# Patient Record
Sex: Female | Born: 1940 | Race: Black or African American | Hispanic: No | Smoking: Never smoker
Health system: Southern US, Community
[De-identification: ages and names within clinical notes are randomized; demographics above are authoritative.]

---

## 2015-07-13 ENCOUNTER — Emergency Department (HOSPITAL_COMMUNITY)
Admission: EM | Admit: 2015-07-13 | Discharge: 2015-07-13 | Disposition: A | Discharge status: Home / Self Care | Payer: Medicare Other | Attending: Emergency Medicine | Admitting: Emergency Medicine

## 2015-07-13 ENCOUNTER — Encounter (HOSPITAL_COMMUNITY): Payer: Self-pay | Admitting: Emergency Medicine

## 2015-07-13 DIAGNOSIS — R519 Headache, unspecified: Secondary | ICD-10-CM

## 2015-07-13 DIAGNOSIS — R252 Cramp and spasm: Secondary | ICD-10-CM

## 2015-07-13 DIAGNOSIS — R51 Headache: Secondary | ICD-10-CM | POA: Diagnosis not present

## 2015-07-13 DIAGNOSIS — I10 Essential (primary) hypertension: Secondary | ICD-10-CM | POA: Insufficient documentation

## 2015-07-13 DIAGNOSIS — M79606 Pain in leg, unspecified: Secondary | ICD-10-CM | POA: Diagnosis not present

## 2015-07-13 NOTE — ED Notes (Signed)
Pt states she has no pain or complaints. Endorses occasional leg cramps and stress. Pt states she was reading an article about secret heart disease and became concerned and would like to be checked out.

## 2015-07-13 NOTE — ED Provider Notes (Signed)
CSN: [id number] date & time 07/13/15  1122 History   First MD Initiated Contact with Patient 07/13/15 1206     Chief Complaint  Patient presents with  . Headache  . Leg Pain     (Consider location/radiation/quality/duration/timing/severity/associated sxs/prior Treatment) HPI  75 year old female presents with a chief complaint of headache and leg pain. She states she's been getting cramps in her legs intermittently for months. Has had an intermittent right-sided headache for a similar time. Last night she was reading an article in a magazine that discussed risks of heart disease. It inferred or she inferred that leg cramps and swelling in her legs could be a sign of heart disease. She is concerned that she may be having blockages in her heart. She has not had any chest pain or shortness of breath. Last had a headache last night. Does not get a headache every day. No dizziness or weakness. No blurry vision. Had leg cramps last night as well at the same time. Both of these are resolved currently. Patient states she has no medical problems and is not on medicines. She sees the [facility] in [place]. She has been living in [place] 11 years. She states that she wanted her blood pressure checked and an evaluation.   History reviewed. No pertinent past medical history. History reviewed. No pertinent past surgical history. History reviewed. No pertinent family history. Social History  Substance Use Topics  . Smoking status: Never Smoker   . Smokeless tobacco: None  . Alcohol Use: No   OB History    No data available     Review of Systems  Constitutional: Negative for fever.  Eyes: Negative for visual disturbance.  Respiratory: Negative for shortness of breath.   Cardiovascular: Negative for chest pain and leg swelling.  Gastrointestinal: Negative for vomiting and abdominal pain.  Musculoskeletal: Positive for myalgias.  Neurological: Positive for headaches. Negative for dizziness,  weakness and numbness.  All other systems reviewed and are negative.     Allergies  Review of patient's allergies indicates no known allergies.  Home Medications   Prior to Admission medications   Not on File   BP 193/77 mmHg  Pulse 70  Temp(Src) 98.4 F (36.9 C) (Oral)  Resp 18  Wt 105 lb (47.628 kg)  SpO2 99% Physical Exam  Constitutional: She is oriented to person, place, and time. She appears well-developed and well-nourished.  HENT:  Head: Normocephalic and atraumatic.  Right Ear: External ear normal.  Left Ear: External ear normal.  Nose: Nose normal.  Eyes: EOM are normal. Pupils are equal, round, and reactive to light. Right eye exhibits no discharge. Left eye exhibits no discharge.  Neck: Neck supple.  Cardiovascular: Normal rate, regular rhythm and normal heart sounds.   Pulses:      Dorsalis pedis pulses are 2+ on the right side, and 2+ on the left side.  Pulmonary/Chest: Effort normal and breath sounds normal.  Abdominal: Soft. There is no tenderness.  Neurological: She is alert and oriented to person, place, and time.  CN 2-12 grossly intact. 5/5 strength in all 4 extremities. Grossly normal sensation  Skin: Skin is warm and dry.  Nursing note and vitals reviewed.   ED Course  Procedures (including critical care time) Labs Review Labs Reviewed - No data to display  Imaging Review No results found. I have personally reviewed and evaluated these images and lab results as part of my medical decision-making.   EKG Interpretation None  MDM   Final diagnoses:  Right-sided headache  Cramps of lower extremity, unspecified laterality  Essential hypertension    Patient currently is asymptomatic. I highly doubt ACS given no or recently current CP or dyspnea. Discussed possible lab testing given HTN with her age and no prior labwork. She declines. Also does not want imaging of head given headache over 1 month. She mostly seems to want PCP follow up.  I discussed her needing to call the number on paperwork to help set up PCP. She seems reasonable and appears to be able to make medical decisions and thus will discharge without workup. Discussed return precautions.    Pricilla LovelessScott Lilou Kneip, MD 07/13/15 [phone]

## 2015-07-13 NOTE — ED Notes (Signed)
Pt sts HA and leg cramps x 1 month

## 2016-08-07 ENCOUNTER — Encounter (HOSPITAL_COMMUNITY): Payer: Self-pay | Admitting: Nurse Practitioner

## 2016-08-07 ENCOUNTER — Emergency Department (HOSPITAL_COMMUNITY)
Admission: EM | Admit: 2016-08-07 | Discharge: 2016-08-07 | Disposition: A | Discharge status: Home / Self Care | Payer: Medicare HMO | Attending: Emergency Medicine | Admitting: Emergency Medicine

## 2016-08-07 DIAGNOSIS — M79652 Pain in left thigh: Secondary | ICD-10-CM | POA: Diagnosis present

## 2016-08-07 DIAGNOSIS — Y9389 Activity, other specified: Secondary | ICD-10-CM | POA: Diagnosis not present

## 2016-08-07 DIAGNOSIS — Y999 Unspecified external cause status: Secondary | ICD-10-CM | POA: Diagnosis not present

## 2016-08-07 DIAGNOSIS — Y92039 Unspecified place in apartment as the place of occurrence of the external cause: Secondary | ICD-10-CM | POA: Diagnosis not present

## 2016-08-07 DIAGNOSIS — X500XXA Overexertion from strenuous movement or load, initial encounter: Secondary | ICD-10-CM | POA: Insufficient documentation

## 2016-08-07 DIAGNOSIS — R252 Cramp and spasm: Secondary | ICD-10-CM | POA: Diagnosis not present

## 2016-08-07 NOTE — Discharge Instructions (Signed)
Take anti-inflammatory medications as needed for muscle pains. Keep well hydrated while you are moving boxes, including electrolyte solutions.  Return for worsening symptoms, including difficulty breathing, passing out, chest pain, leg swelling, new weakness/numbness, difficulty walking, or any other symptoms concerning to you.

## 2016-08-07 NOTE — ED Triage Notes (Signed)
Pt presents with c/o right leg pain. She describes the pain as a cramping from her right groin to right knee. The pain began last week after she was moving furniture around her apartment. She has taken aleve with relieve but is concerned that the pain persists.

## 2016-08-07 NOTE — ED Provider Notes (Signed)
[facility]-EMERGENCY DEPT Provider Note   CSN: [id number] Arrival date & time: 08/07/16  1100   By signing my name below, I, Clarisse GougeXavier Herndon, attest that this documentation has been prepared under the direction and in the presence of Liu, Neysa Bonitoana Duo, MD. Electronically signed, Clarisse GougeXavier Herndon, ED Scribe. 08/07/16. 1:41 PM.   History   Chief Complaint Chief Complaint  Patient presents with  . Leg Pain   The history is provided by the patient and medical records. No language interpreter was used.  Leg Pain   This is a new problem. The problem occurs daily. The problem has been gradually worsening. The pain is present in the left upper leg and right upper leg. The quality of the pain is described as constant. The pain is at a severity of 5/10. The pain is moderate. Pertinent negatives include no numbness, full range of motion, no stiffness, no tingling and no itching. The symptoms are aggravated by contact. She has tried OTC pain medications, activity and rest for the symptoms. The treatment provided mild relief. There has been no history of extremity trauma. Family history is significant for no rheumatoid arthritis and no gout.    Danielle Acevedo is a 76 y.o. female presenting to the Emergency Department concerning intermittent leg cramps x ~1 week. She states her pain began after she lifted heavy furniture while moving ~1 week ago. Occasional headache also noted. She states her pain has improved from 9/10 to 5/10 pain from triage to evaluation. Now pain free. She describes fluctuating soreness and cramping occasionally radiating to the groin. Pt taking aleve and tylenol with minimal relief. She states she is mostly indoors. She notes NL food and fluid intake and she states she is currently awaiting dentures. Pt notes h/o leg cramps last year. Pt reportedly very active normally. She states she participates in the [employer], but her pain prevented her participation last year. She is concerned that her pain  may be a sign of stroke. No weakness, numbness, chest pain, SOB or trauma noted. No other complaints at this time.   History reviewed. No pertinent past medical history.  There are no active problems to display for this patient.   History reviewed. No pertinent surgical history.  OB History    No data available       Home Medications    Prior to Admission medications   Not on File    Family History History reviewed. No pertinent family history.  Social History Social History  Substance Use Topics  . Smoking status: Never Smoker  . Smokeless tobacco: Never Used  . Alcohol use No     Allergies   Patient has no known allergies.   Review of Systems Review of Systems  Musculoskeletal: Positive for arthralgias, gait problem and myalgias. Negative for stiffness.  Skin: Negative for color change, itching and wound.  Neurological: Positive for headaches. Negative for tingling and numbness.  All other systems reviewed and are negative.    Physical Exam Updated Vital Signs BP (!) 141/91 (BP Location: Left Arm)   Pulse 64   Temp 98 F (36.7 C) (Oral)   Resp 20   SpO2 99%   Physical Exam Physical Exam  Nursing note and vitals reviewed. Constitutional: Well developed, well nourished, non-toxic, and in no acute distress Head: Normocephalic and atraumatic.  Mouth/Throat: Oropharynx is clear and moist.  Neck: Normal range of motion. Neck supple.  Cardiovascular: Normal rate and regular rhythm.   Pulmonary/Chest: Effort normal and breath sounds normal.  Abdominal: Soft. There is no tenderness. There is no rebound and no guarding.  Musculoskeletal: Normal range of motion.  Neurological: Alert, no facial droop, fluent speech, moves all extremities symmetrically. Bilateral DP pulses intact. Full strength and sensation in BLE's. Skin: Skin is warm and dry.  Psychiatric: Cooperative   ED Treatments / Results  DIAGNOSTIC STUDIES: Oxygen Saturation is 99% on RA, NL by  my interpretation.    COORDINATION OF CARE: 1:37 PM-Discussed next steps with pt. Pt verbalized understanding and is agreeable with the plan. Pt prepared for d/c, advised of symptomatic care at home and return precautions.    Labs (all labs ordered are listed, but only abnormal results are displayed) Labs Reviewed - No data to display  EKG  EKG Interpretation None       Radiology No results found.  Procedures Procedures (including critical care time)  Medications Ordered in ED Medications - No data to display   Initial Impression / Assessment and Plan / ED Course  I have reviewed the triage vital signs and the nursing notes.  Pertinent labs & imaging results that were available during my care of the patient were reviewed by me and considered in my medical decision making (see chart for details).     Presents with intermittent leg cramps since lifting boxes and moving furniture this week. Came in because she was concerned about stroke after reading about it online. Had severe right leg cramp prior to arrival, but now pain free w/o intervention. Exam shows Neurovascularly intact bilateral lower extremities. No tenderness elicited on exam. No concern for CVA. No leg swelling or concern for DVT at this time. Discussed supportive care management and rest. Strict return and follow-up instructions reviewed. She expressed understanding of all discharge instructions and felt comfortable with the plan of care.   Final Clinical Impressions(s) / ED Diagnoses   Final diagnoses:  Muscle cramp    New Prescriptions New Prescriptions   No medications on file   I personally performed the services described in this documentation, which was scribed in my presence. The recorded information has been reviewed and is accurate.     Lavera Guise, MD 08/07/16 1350

## 2016-12-31 ENCOUNTER — Emergency Department (HOSPITAL_COMMUNITY)
Admission: EM | Admit: 2016-12-31 | Discharge: 2016-12-31 | Disposition: A | Discharge status: Home / Self Care | Payer: Medicare HMO | Attending: Emergency Medicine | Admitting: Emergency Medicine

## 2016-12-31 ENCOUNTER — Other Ambulatory Visit: Payer: Self-pay

## 2016-12-31 ENCOUNTER — Encounter (HOSPITAL_COMMUNITY): Payer: Self-pay | Admitting: Emergency Medicine

## 2016-12-31 DIAGNOSIS — R252 Cramp and spasm: Secondary | ICD-10-CM | POA: Diagnosis not present

## 2016-12-31 DIAGNOSIS — M79651 Pain in right thigh: Secondary | ICD-10-CM | POA: Diagnosis present

## 2016-12-31 DIAGNOSIS — M79604 Pain in right leg: Secondary | ICD-10-CM

## 2016-12-31 NOTE — ED Triage Notes (Signed)
Pt stating she has right leg pain and wants to be checked out before she competes in a track competition with the [employer].  Pt denies any other symptoms.

## 2016-12-31 NOTE — ED Notes (Signed)
Pt stable,ambulatory, and verbalizes understanding of D/C instructions.   

## 2016-12-31 NOTE — ED Provider Notes (Signed)
[facility] Provider Note   CSN: [id number] Arrival date & time: 12/31/16  0741     History   Chief Complaint Chief Complaint  Patient presents with  . Leg Pain    HPI Danielle Acevedo is a 76 y.o. female who presents with right thigh cramping.  No significant past medical history.  The patient states that she is planning to run a marathon and wanted to have her leg checked before she signed up for the race.  She states she has a problem before intermittently.  The cramping goes from the right proximal anterior thigh to the distal thigh.  She also has cramping over the right buttocks area.  The cramping seems to be worse after long periods of activity.  It is better with rest.  She sometimes has it at night as well.  She is eating and drinking normally.  She takes Motrin or Tylenol for pain as needed.  She does not have a primary care provider.  HPI  History reviewed. No pertinent past medical history.  There are no active problems to display for this patient.   History reviewed. No pertinent surgical history.  OB History    No data available       Home Medications    Prior to Admission medications   Not on File    Family History History reviewed. No pertinent family history.  Social History Social History   Tobacco Use  . Smoking status: Never Smoker  . Smokeless tobacco: Never Used  Substance Use Topics  . Alcohol use: No  . Drug use: No     Allergies   Patient has no known allergies.   Review of Systems Review of Systems  Respiratory: Negative for shortness of breath.   Cardiovascular: Negative for chest pain.  Musculoskeletal: Negative for arthralgias, gait problem, joint swelling and myalgias.       +cramping  All other systems reviewed and are negative.    Physical Exam Updated Vital Signs BP (!) 154/72   Pulse (!) 56   Ht 5\' 3"  (1.6 m)   Wt 52.2 kg (115 lb)   SpO2 100%   BMI 20.37 kg/m   Physical  Exam  Constitutional: She is oriented to person, place, and time. She appears well-developed and well-nourished. No distress.  HENT:  Head: Normocephalic and atraumatic.  Eyes: Conjunctivae are normal. Pupils are equal, round, and reactive to light. Right eye exhibits no discharge. Left eye exhibits no discharge. No scleral icterus.  Neck: Normal range of motion.  Cardiovascular: Normal rate.  Pulmonary/Chest: Effort normal. No respiratory distress.  Abdominal: She exhibits no distension.  Musculoskeletal:  Right lower extremity: No obvious swelling, deformity, or warmth. No tenderness or spasms noted. FROM of knee and hip. N/V intact.   Neurological: She is alert and oriented to person, place, and time.  Skin: Skin is warm and dry.  Psychiatric: She has a normal mood and affect. Her behavior is normal.  Nursing note and vitals reviewed.    ED Treatments / Results  Labs (all labs ordered are listed, but only abnormal results are displayed) Labs Reviewed - No data to display  EKG  EKG Interpretation None       Radiology No results found.  Procedures Procedures (including critical care time)  Medications Ordered in ED Medications - No data to display   Initial Impression / Assessment and Plan / ED Course  I have reviewed the triage vital signs and the nursing  notes.  Pertinent labs & imaging results that were available during my care of the patient were reviewed by me and considered in my medical decision making (see chart for details).  76 year old female with intermittent leg cramping. Vitals are normal. She denies any symptoms currently. I talked with her at length about getting a primary doctor for her check ups and preventative care and she basically refused saying that "[facility] has the best care for seniors". She states she doesn't receive preventative care anyways - she doesn't want colonoscopies or vaccinations, etc. She does have a PCP [facility] in [place] but  hasn't seen them in years. Her exam is unremarkable. I offered to check electrolytes today but she states she has to go get ready for church and will come back another time. Pt discharged in good condition.  Final Clinical Impressions(s) / ED Diagnoses   Final diagnoses:  Leg cramping  Right leg pain    ED Discharge Orders    None       Bethel BornGekas, Kelly Marie, PA-C 12/31/16 [id number]    Margarita Grizzleay, Danielle, MD 12/31/16 1016

## 2017-09-03 ENCOUNTER — Emergency Department (HOSPITAL_COMMUNITY)
Admission: EM | Admit: 2017-09-03 | Discharge: 2017-09-03 | Disposition: A | Discharge status: Home / Self Care | Payer: Medicare HMO | Attending: Emergency Medicine | Admitting: Emergency Medicine

## 2017-09-03 ENCOUNTER — Emergency Department (HOSPITAL_COMMUNITY): Payer: Medicare HMO

## 2017-09-03 ENCOUNTER — Encounter (HOSPITAL_COMMUNITY): Payer: Self-pay | Admitting: *Deleted

## 2017-09-03 ENCOUNTER — Other Ambulatory Visit: Payer: Self-pay

## 2017-09-03 DIAGNOSIS — Y999 Unspecified external cause status: Secondary | ICD-10-CM | POA: Insufficient documentation

## 2017-09-03 DIAGNOSIS — W230XXA Caught, crushed, jammed, or pinched between moving objects, initial encounter: Secondary | ICD-10-CM | POA: Diagnosis not present

## 2017-09-03 DIAGNOSIS — Y9389 Activity, other specified: Secondary | ICD-10-CM | POA: Insufficient documentation

## 2017-09-03 DIAGNOSIS — Y92009 Unspecified place in unspecified non-institutional (private) residence as the place of occurrence of the external cause: Secondary | ICD-10-CM | POA: Insufficient documentation

## 2017-09-03 DIAGNOSIS — S60122A Contusion of left index finger with damage to nail, initial encounter: Secondary | ICD-10-CM | POA: Diagnosis not present

## 2017-09-03 DIAGNOSIS — S6992XA Unspecified injury of left wrist, hand and finger(s), initial encounter: Secondary | ICD-10-CM | POA: Diagnosis present

## 2017-09-03 DIAGNOSIS — S60022A Contusion of left index finger without damage to nail, initial encounter: Secondary | ICD-10-CM

## 2017-09-03 NOTE — Discharge Instructions (Addendum)
Please continue to keep finger elevated. Alternate ice or heat as needed.Please take ibuprofen for pain as needed. Please return to the ED if you experience any tingling on extremity, finger becomes cold, or color changes.

## 2017-09-03 NOTE — ED Triage Notes (Signed)
Pt reports slamming her left index finger tip in her closet door approx 6 hours ago, swelling noted to fingertip. States pain radiates up her arm. No redness noted to her arm. No acute distress is noted at triage.

## 2017-09-03 NOTE — ED Provider Notes (Signed)
[facility]   CSN: [id number] Arrival date & time: 09/03/17  1759     History   Chief Complaint Chief Complaint  Patient presents with  . Finger Injury    HPI Danielle Acevedo is a 77 y.o. female.  77 year old female with no past medical history presents to the ED status post finger injury this morning.  She reports that she was closing her closet door when she smashed her finger with the door.  She then states that she felt some tingling to her hand and elbow and left shoulder.  She reports the tingling has resolved patient has tried applying some over-the-counter muscle pain relief cream.  She states she got alarmed when she saw her finger swelling.  Patient has not taken any medication for the pain but states that she has tried to keep finger elevated.  She denies any other complaints at this time, chest pain, or shortness of breath.     History reviewed. No pertinent past medical history.  There are no active problems to display for this patient.   History reviewed. No pertinent surgical history.   OB History   None      Home Medications    Prior to Admission medications   Not on File    Family History History reviewed. No pertinent family history.  Social History Social History   Tobacco Use  . Smoking status: Never Smoker  . Smokeless tobacco: Never Used  Substance Use Topics  . Alcohol use: No  . Drug use: No     Allergies   Patient has no known allergies.   Review of Systems Review of Systems  Constitutional: Negative for chills and fever.  HENT: Negative for ear pain and sore throat.   Eyes: Negative for pain and visual disturbance.  Respiratory: Negative for cough and shortness of breath.   Cardiovascular: Negative for chest pain and palpitations.  Gastrointestinal: Negative for abdominal pain and vomiting.  Genitourinary: Negative for dysuria and hematuria.  Musculoskeletal: Negative for  arthralgias and back pain.  Skin: Positive for wound. Negative for color change and rash.  Neurological: Negative for seizures and syncope.  All other systems reviewed and are negative.    Physical Exam Updated Vital Signs BP (!) 177/59 (BP Location: Right Arm)   Pulse 63   Temp 98.7 F (37.1 C)   Resp 16   SpO2 99%   Physical Exam  Constitutional: She is oriented to person, place, and time. She appears well-developed and well-nourished. No distress.  HENT:  Head: Normocephalic and atraumatic.  Mouth/Throat: Oropharynx is clear and moist. No oropharyngeal exudate.  Eyes: Pupils are equal, round, and reactive to light.  Neck: Normal range of motion.  Cardiovascular: Regular rhythm and normal heart sounds.  Pulses:      Radial pulses are 2+ on the right side, and 2+ on the left side.  Pulmonary/Chest: Effort normal and breath sounds normal. No respiratory distress.  Abdominal: Soft. Bowel sounds are normal. She exhibits no distension. There is no tenderness.  Musculoskeletal: She exhibits edema and tenderness. She exhibits no deformity.       Left hand: She exhibits tenderness and swelling. Normal sensation noted. Decreased sensation is not present in the ulnar distribution, is not present in the medial redistribution and is not present in the radial distribution. Normal strength noted. She exhibits no finger abduction, no thumb/finger opposition and no wrist extension trouble.       Hands:  Right lower leg: She exhibits no edema.       Left lower leg: She exhibits no edema.  Subdermal hematoma and swelling noted. No skin breakage. Patient able to move finger.Pulses presents BL.   Neurological: She is alert and oriented to person, place, and time.  Skin: Skin is warm and dry. Capillary refill takes less than 2 seconds. There is erythema.  Psychiatric: She has a normal mood and affect.  Nursing note and vitals reviewed.    ED Treatments / Results  Labs (all labs ordered  are listed, but only abnormal results are displayed) Labs Reviewed - No data to display  EKG None  Radiology Dg Finger Index Left  Result Date: 09/03/2017 CLINICAL DATA:  Shut index finger in door with bruise and swelling to the distal finger EXAM: LEFT INDEX FINGER 2+V COMPARISON:  None. FINDINGS: No acute displaced fracture or malalignment. No radiopaque foreign body. Degenerative changes at the DIP and MCP joints. IMPRESSION: No acute osseous abnormality. Electronically Signed   By: Jasmine Pang M.D.   On: 09/03/2017 19:06    Procedures Procedures (including critical care time)  Medications Ordered in ED Medications - No data to display   Initial Impression / Assessment and Plan / ED Course  I have reviewed the triage vital signs and the nursing notes.  Pertinent labs & imaging results that were available during my care of the patient were reviewed by me and considered in my medical decision making (see chart for details).    She reports with a left index finger contusion.  There is no breakage in the skin, drainage, no decreased strength.  As time I have placed the patient on a left index finger splint.  I have advised her to take Tylenol or ibuprofen for pain.  Return precautions have been provided.  Final Clinical Impressions(s) / ED Diagnoses   Final diagnoses:  Contusion of left index finger without damage to nail, initial encounter    ED Discharge Orders    None       Freddy Jaksch 09/03/17 2129    Wynetta Fines, MD 09/03/17 2303

## 2017-12-05 ENCOUNTER — Other Ambulatory Visit: Payer: Self-pay

## 2017-12-05 ENCOUNTER — Encounter (HOSPITAL_COMMUNITY): Payer: Self-pay | Admitting: *Deleted

## 2017-12-05 ENCOUNTER — Emergency Department (HOSPITAL_COMMUNITY)
Admission: EM | Admit: 2017-12-05 | Discharge: 2017-12-05 | Disposition: A | Discharge status: Home / Self Care | Payer: Medicare HMO | Attending: Emergency Medicine | Admitting: Emergency Medicine

## 2017-12-05 DIAGNOSIS — L720 Epidermal cyst: Secondary | ICD-10-CM

## 2017-12-05 DIAGNOSIS — M79642 Pain in left hand: Secondary | ICD-10-CM | POA: Diagnosis present

## 2017-12-05 NOTE — ED Provider Notes (Signed)
[facility] EMERGENCY DEPARTMENT Provider Note   CSN: [id number] Arrival date & time: 12/05/17  1134     History   Chief Complaint Chief Complaint  Patient presents with  . Hand Pain    HPI Danielle Acevedo is a 77 y.o. female who presents to ED for nondominant left hand concern.  States that she had an injury to her left index finger about 3 months ago when she smashed her finger in her closet door.  She was seen and evaluated here, given supportive measures which improved her symptoms.  However, since then she is noticed this mobile mass at the palmar base of her left thumb.  States that she has intermittent pain associated with it at night but otherwise does not bother her.  No history of similar symptoms in the past.  Denies any reinjuries, changes to sensation.  HPI  History reviewed. No pertinent past medical history.  There are no active problems to display for this patient.   History reviewed. No pertinent surgical history.   OB History   None      Home Medications    Prior to Admission medications   Not on File    Family History History reviewed. No pertinent family history.  Social History Social History   Tobacco Use  . Smoking status: Never Smoker  . Smokeless tobacco: Never Used  Substance Use Topics  . Alcohol use: No  . Drug use: No     Allergies   Patient has no known allergies.   Review of Systems Review of Systems  Constitutional: Negative for chills and fever.  Skin: Negative for wound.  Neurological: Negative for weakness and numbness.     Physical Exam Updated Vital Signs BP (!) 146/55 (BP Location: Right Arm)   Pulse 64   Temp 97.8 F (36.6 C) (Oral)   Resp 18   SpO2 98%   Physical Exam  Constitutional: She appears well-developed and well-nourished. No distress.  HENT:  Head: Normocephalic and atraumatic.  Eyes: Conjunctivae and EOM are normal. No scleral icterus.  Neck: Normal range of motion.    Pulmonary/Chest: Effort normal. No respiratory distress.  Neurological: She is alert.  Skin: No rash noted. She is not diaphoretic.  0.5 cm round, mobile cystic-like structure noted at the base of the left thumb on the palmar side.  No overlying erythema, tenderness to palpation, induration noted.  Psychiatric: She has a normal mood and affect.  Nursing note and vitals reviewed.    ED Treatments / Results  Labs (all labs ordered are listed, but only abnormal results are displayed) Labs Reviewed - No data to display  EKG None  Radiology No results found.  Procedures Procedures (including critical care time)  Medications Ordered in ED Medications - No data to display   Initial Impression / Assessment and Plan / ED Course  I have reviewed the triage vital signs and the nursing notes.  Pertinent labs & imaging results that were available during my care of the patient were reviewed by me and considered in my medical decision making (see chart for details).     77 year old female presents for lesion at the base of her left thumb that she noticed about 3 months ago.  States that it is a cystlike structure that moves when palpated.  It is not tender to touch.  On exam there is a 0.5 cm round, cystic-like mobile structure noted at the base of the left thumb.  No surrounding induration, erythema  or warmth of area noted.  She has full active and passive range of motion of digits.  Area is neurovascularly intact.  Suspect that symptoms are due to an epidermal cyst.  Will give hand follow-up but patient reassured that this will most likely spontaneously resolve.  Doubt vascular or infectious cause of symptoms.  Advised to return to ED for any severe worsening symptoms.  Portions of this note were generated with Scientist, clinical (histocompatibility and immunogenetics). Dictation errors may occur despite best attempts at proofreading.  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

## 2017-12-05 NOTE — Discharge Instructions (Signed)
Follow up with the hand specialist for further ideation if your symptoms persist. Return to ED for worsening symptoms, injuries or falls, redness or warmth of joints, losing sensation of arms.

## 2017-12-05 NOTE — ED Triage Notes (Signed)
Pt in c/o new knot to her left hand, she had an injury about two months ago and it healed, a few days ago she noted a small nodule under her skin that is movable, no significant pain but wants to get it checked

## 2018-04-09 ENCOUNTER — Encounter (HOSPITAL_COMMUNITY): Payer: Self-pay | Admitting: Emergency Medicine

## 2018-04-09 ENCOUNTER — Other Ambulatory Visit: Payer: Self-pay

## 2018-04-09 ENCOUNTER — Emergency Department (HOSPITAL_COMMUNITY)
Admission: EM | Admit: 2018-04-09 | Discharge: 2018-04-09 | Disposition: A | Discharge status: Home / Self Care | Payer: Medicare HMO | Attending: Emergency Medicine | Admitting: Emergency Medicine

## 2018-04-09 DIAGNOSIS — M62838 Other muscle spasm: Secondary | ICD-10-CM | POA: Insufficient documentation

## 2018-04-09 DIAGNOSIS — R252 Cramp and spasm: Secondary | ICD-10-CM

## 2018-04-09 NOTE — Discharge Instructions (Signed)
You have been seen today for muscle cramp. Please read and follow all provided instructions.   1. Medications: usual home medications 2. Treatment: rest, drink plenty of fluids 3. Follow Up: Please follow up with your primary doctor in 7 days for discussion of your diagnoses and further evaluation after today's visit; if you do not have a primary care doctor use the resource guide provided to find one; Please return to the ER for any new or worsening symptoms. Please obtain all of your results from medical records or have your doctors office obtain the results - share them with your doctor - you should be seen at your doctors office. Call today to arrange your follow up.   Take medications as prescribed. Please review all of the medicines and only take them if you do not have an allergy to them. Return to the emergency room for worsening condition or new concerning symptoms. Follow up with your regular doctor. If you don't have a regular doctor use one of the numbers below to establish a primary care doctor.  Please be aware that if you are taking birth control pills, taking other prescriptions, ESPECIALLY ANTIBIOTICS may make the birth control ineffective - if this is the case, either do not engage in sexual activity or use alternative methods of birth control such as condoms until you have finished the medicine and your family doctor says it is OK to restart them. If you are on a blood thinner such as COUMADIN, be aware that any other medicine that you take may cause the coumadin to either work too much, or not enough - you should have your coumadin level rechecked in next 7 days if this is the case.  ?  It is also a possibility that you have an allergic reaction to any of the medicines that you have been prescribed - Everybody reacts differently to medications and while MOST people have no trouble with most medicines, you may have a reaction such as nausea, vomiting, rash, swelling, shortness of breath.  If this is the case, please stop taking the medicine immediately and contact your physician.  ?  You should return to the ER if you develop severe or worsening symptoms.   Emergency Department Resource Guide 1) Find a Doctor and Pay Out of Pocket Although you won't have to find out who is covered by your insurance plan, it is a good idea to ask around and get recommendations. You will then need to call the office and see if the doctor you have chosen will accept you as a new patient and what types of options they offer for patients who are self-pay. Some doctors offer discounts or will set up payment plans for their patients who do not have insurance, but you will need to ask so you aren't surprised when you get to your appointment.  2) Contact Your Local Health Department Not all health departments have doctors that can see patients for sick visits, but many do, so it is worth a call to see if yours does. If you don't know where your local health department is, you can check in your phone book. The CDC also has a tool to help you locate your state's health department, and many state websites also have listings of all of their local health departments.  3) Find a Walk-in Clinic If your illness is not likely to be very severe or complicated, you may want to try a walk in clinic. These are popping up all over  the country in pharmacies, drugstores, and shopping centers. They're usually staffed by nurse practitioners or physician assistants that have been trained to treat common illnesses and complaints. They're usually fairly quick and inexpensive. However, if you have serious medical issues or chronic medical problems, these are probably not your best option.  No Primary Care Doctor: Call Health Connect at  [phone] - they can help you locate a primary care doctor that  accepts your insurance, provides certain services, etc. Physician Referral [phone]  Emergency Department Resource  Guide 1) Find a Doctor and Pay Out of Pocket Although you won't have to find out who is covered by your insurance plan, it is a good idea to ask around and get recommendations. You will then need to call the office and see if the doctor you have chosen will accept you as a new patient and what types of options they offer for patients who are self-pay. Some doctors offer discounts or will set up payment plans for their patients who do not have insurance, but you will need to ask so you aren't surprised when you get to your appointment.  2) Contact Your Local Health Department Not all health departments have doctors that can see patients for sick visits, but many do, so it is worth a call to see if yours does. If you don't know where your local health department is, you can check in your phone book. The CDC also has a tool to help you locate your state's health department, and many state websites also have listings of all of their local health departments.  3) Find a [facility] If your illness is not likely to be very severe or complicated, you may want to try a walk in clinic. These are popping up all over the country in pharmacies, drugstores, and shopping centers. They're usually staffed by nurse practitioners or physician assistants that have been trained to treat common illnesses and complaints. They're usually fairly quick and inexpensive. However, if you have serious medical issues or chronic medical problems, these are probably not your best option.  No Primary Care Doctor: Call Health Connect at  [phone] - they can help you locate a primary care doctor that  accepts your insurance, provides certain services, etc. Physician Referral Service- [phone]  Chronic Pain Problems: Organization         Address  Phone   Notes  [facility]  [phone] Patients need to be referred by their primary care doctor.   Medication Assistance: Organization          Address  Phone   Notes  [facility] Medication [facility] [address] [place], [place] [address] [phone] --Must be a resident of [facility] -- Must have NO insurance coverage whatsoever (no Medicaid/ Medicare, etc.) -- The pt. MUST have a primary care doctor that directs their care regularly and follows them in the community   [facility]  [phone]   [employer]  [phone]    Agencies that provide inexpensive medical care: Organization         Address  Phone   Notes  [place]  [phone]   [facility]    [phone]   [facility] [place], [place] [address] [phone]   [place] [address] [place] [phone]   [facility]    [phone]   [facility]    [phone]   °[facility] ° [address], [place] Phone:  [phone], Fax:  [phone] Hours of Operation:  9 am - 6 pm, M-F.  Also accepts Medicaid/Medicare and self-pay.  °[facility] ° [address], [place] Phone: [phone], Fax: [phone]. Hours of Operation:  8:30 am - 5:30 pm, M-F.  Also accepts Medicaid and self-pay.  °[facility] [place] [address], [place] Phone: [phone]   °[facility] [address], [place], [place] [phone], Ext. [phone] Mondays & Thursdays: 7-9 AM.  First 15 patients are seen on a first come, first serve basis. °  ° °Medicaid-accepting [place] Providers: ° °Organization         Address  Phone   Notes  °[facility] [address], [place] [phone] Also accepts self-pay patients.  °[facility] [address], [place] ° [phone]   °[facility] [address], [place] [phone]   °[facility] [address] [place] [address], [place] [phone]   °[facility] [address], [place]  ° [phone] Only accepts [place] Access Medicaid patients after they have their name applied to their card.  ° °Self-Pay (no insurance) in [place]: ° °Organization         Address  Phone   Notes  °Sickle Cell Patients, [facility] [address], [place] [phone]   °[facility] [address], [place] [phone]   °[facility] ° [address],  [phone]   °[facility]/Dr. Osei-Bonsu ° [address] [place] [address], [place] or [address], [place] [phone] Phone number for both [place] and [place] locations is the same.  °[facility] [address], [place] [phone]   °[facility]  [address] [place] [address],  or [address] [phone] °[phone]   °[facility] [address],  [phone], phone; [phone], fax Sees patients 1st and 3rd Saturday of every month.  Must not qualify for public or private insurance (i.e. Medicaid, Medicare, [place] Health Choice, Veterans' Benefits)  Household income should be no more than 200% of the poverty level The clinic cannot treat you if you are pregnant or think you are pregnant  Sexually transmitted diseases are not treated at the clinic.  °  ° °

## 2018-04-09 NOTE — ED Provider Notes (Signed)
[facility] EMERGENCY DEPARTMENT Provider Note   CSN: [id number] Arrival date & time: 04/09/18  3762    History   Chief Complaint No chief complaint on file.   HPI Danielle Acevedo is a 78 y.o. female presenting with no significant past medical history presenting with intermittent right thigh cramping for years. Patient reports cramping yesterday, but states symptoms resolved with aleve. Patient states walking on cement floor makes symptoms worse. Patient states she is eating and drinking fluids. Patient denies fever, cough, chest pain, shortness of breath, nausea, vomiting, abdominal pain, or diarrhea. Patient denies sick contacts. Patient reports she is very active in [employer]. Patient states she walks and plays basketball. Patient denies weakness, numbness, or difficulty walking. Patient reports she is moving to [place] and wants to set up a PCP when she moves. Patient denies injury.     HPI  History reviewed. No pertinent past medical history.  There are no active problems to display for this patient.   History reviewed. No pertinent surgical history.   OB History   No obstetric history on file.      Home Medications    Prior to Admission medications   Not on File    Family History No family history on file.  Social History Social History   Tobacco Use  . Smoking status: Never Smoker  . Smokeless tobacco: Never Used  Substance Use Topics  . Alcohol use: No  . Drug use: No     Allergies   Patient has no known allergies.   Review of Systems Review of Systems  Constitutional: Negative for chills, diaphoresis and fever.  HENT: Negative for congestion and rhinorrhea.   Eyes: Negative for visual disturbance.  Respiratory: Negative for cough and shortness of breath.   Cardiovascular: Negative for chest pain.  Gastrointestinal: Negative for abdominal pain, diarrhea, nausea and vomiting.  Endocrine: Negative for cold intolerance and  heat intolerance.  Genitourinary: Negative for dysuria.  Musculoskeletal: Negative for arthralgias and back pain.       Pt reports right thigh cramps.  Skin: Negative for color change, rash and wound.  Allergic/Immunologic: Negative for immunocompromised state.  Neurological: Negative for dizziness, tremors, syncope, facial asymmetry, speech difficulty, weakness, numbness and headaches.  Hematological: Negative for adenopathy.     Physical Exam Updated Vital Signs BP (!) 137/52 (BP Location: Right Arm)   Pulse 63   Temp 98 F (36.7 C) (Oral)   Resp 16   SpO2 98%   Physical Exam Vitals signs and nursing note reviewed.  Constitutional:      General: She is not in acute distress.    Appearance: She is well-developed. She is not diaphoretic.  HENT:     Head: Normocephalic and atraumatic.     Nose: Nose normal.     Mouth/Throat:     Mouth: Mucous membranes are moist.     Pharynx: No posterior oropharyngeal erythema.  Eyes:     Extraocular Movements: Extraocular movements intact.     Conjunctiva/sclera: Conjunctivae normal.     Pupils: Pupils are equal, round, and reactive to light.  Neck:     Musculoskeletal: Normal range of motion and neck supple.  Cardiovascular:     Rate and Rhythm: Normal rate and regular rhythm.     Heart sounds: Normal heart sounds. No murmur. No friction rub. No gallop.   Pulmonary:     Effort: Pulmonary effort is normal. No respiratory distress.     Breath sounds: Normal breath sounds.  No wheezing or rales.  Abdominal:     Palpations: Abdomen is soft.     Tenderness: There is no abdominal tenderness.  Musculoskeletal: Normal range of motion.        General: No swelling, tenderness or signs of injury.     Right hip: Normal. She exhibits normal range of motion, normal strength and no tenderness.     Left hip: She exhibits normal range of motion, normal strength and no tenderness.     Right knee: Normal. She exhibits normal range of motion, no  swelling and no effusion.     Left knee: Normal. She exhibits normal range of motion, no swelling and no effusion.     Right ankle: Normal. She exhibits normal range of motion, no swelling and no ecchymosis.     Left ankle: Normal. She exhibits normal range of motion, no swelling and no ecchymosis.     Comments: No skin changes. Non tender on palpation. Full ROM of legs bilaterally without discomfort. 5/5 strength in upper and lower extremities. Sensation intact. Ambulating without difficulty.   Skin:    General: Skin is warm.     Findings: No erythema or rash.  Neurological:     Mental Status: She is alert and oriented to person, place, and time.      ED Treatments / Results  Labs (all labs ordered are listed, but only abnormal results are displayed) Labs Reviewed - No data to display  EKG None  Radiology No results found.  Procedures Procedures (including critical care time)  Medications Ordered in ED Medications - No data to display   Initial Impression / Assessment and Plan / ED Course  I have reviewed the triage vital signs and the nursing notes.  Pertinent labs & imaging results that were available during my care of the patient were reviewed by me and considered in my medical decision making (see chart for details).       Patient presents with intermittent muscle cramps. Patient has been asymptomatic while in the ER. Offered to order labs to evaluate for electrolyte abnormalities and patient refused. Patient states her symptoms improve with aleve and does not want further testing. Patient states she has places to go and does not want to stay in the ER. Discussed drinking plenty of fluids, eating bananas, and stretching before and after exercise. Discussed return precautions with patient. Advised patient to follow up with PCP to monitor symptoms. Patient states she understands and agrees with plan.   Final Clinical Impressions(s) / ED Diagnoses   Final diagnoses:    Muscle cramp    ED Discharge Orders    None       Leretha Dykes, [place] 04/09/18 1041    Rolan Bucco, MD 04/09/18 1041

## 2018-04-09 NOTE — ED Notes (Signed)
Pt verbalized understanding of discharge paperwork and follow-up care.  °

## 2018-04-09 NOTE — ED Triage Notes (Signed)
Pt. Stated, rt. Leg cramping started yesterday morning.

## 2018-04-09 NOTE — ED Notes (Signed)
Pt ambulated from WT to room 35

## 2019-10-27 IMAGING — CR DG FINGER INDEX 2+V*L*
3 series · 3 of 3 positions shown · non-contrast
Comparison: None.

CLINICAL DATA: Shut index finger in door with bruise and swelling
to the distal finger

EXAM:
LEFT INDEX FINGER 2+V

[finger ap]
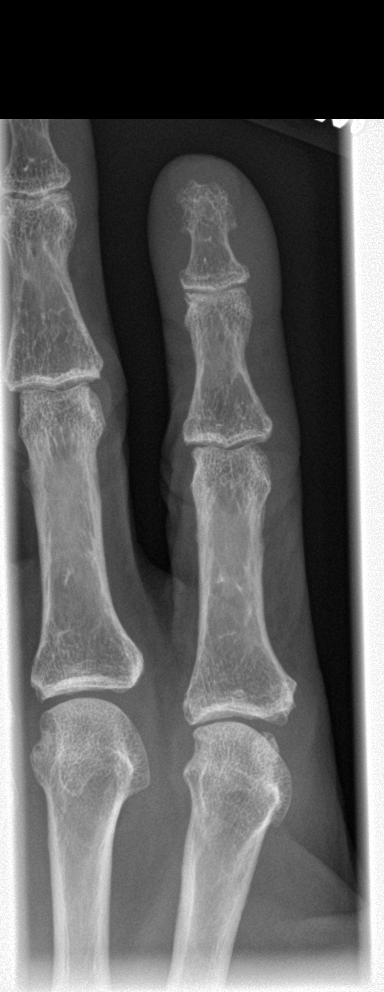

[finger obl]
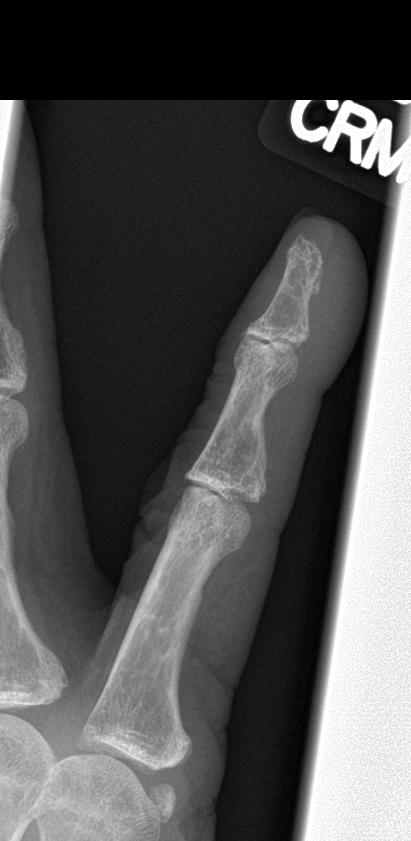

[finger lat]
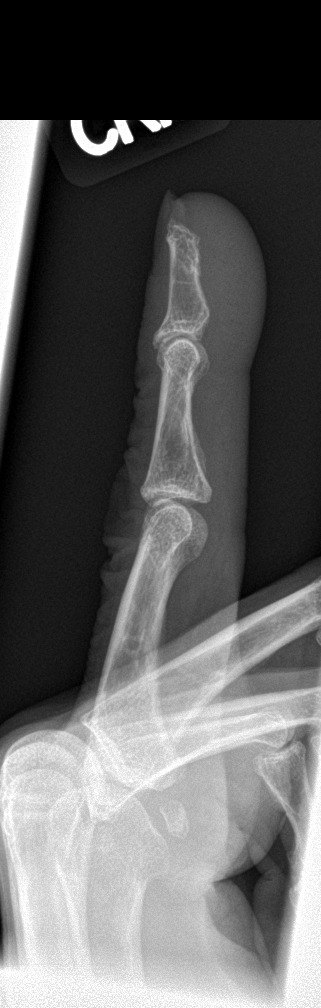

[3 of 3 positions shown; findings below may reference images not displayed]

FINDINGS: No acute displaced fracture or malalignment. No radiopaque foreign
body. Degenerative changes at the DIP and MCP joints.
IMPRESSION: No acute osseous abnormality.

## 2021-07-15 ENCOUNTER — Emergency Department (HOSPITAL_COMMUNITY)
Admission: EM | Admit: 2021-07-15 | Discharge: 2021-07-15 | Disposition: A | Discharge status: Home / Self Care | Payer: Medicare HMO | Attending: Emergency Medicine | Admitting: Emergency Medicine

## 2021-07-15 ENCOUNTER — Other Ambulatory Visit: Payer: Self-pay

## 2021-07-15 ENCOUNTER — Encounter (HOSPITAL_COMMUNITY): Payer: Self-pay

## 2021-07-15 DIAGNOSIS — R0981 Nasal congestion: Secondary | ICD-10-CM | POA: Diagnosis present

## 2021-07-15 DIAGNOSIS — J329 Chronic sinusitis, unspecified: Secondary | ICD-10-CM | POA: Insufficient documentation

## 2021-07-15 DIAGNOSIS — R001 Bradycardia, unspecified: Secondary | ICD-10-CM | POA: Insufficient documentation

## 2021-07-15 DIAGNOSIS — H9201 Otalgia, right ear: Secondary | ICD-10-CM | POA: Diagnosis not present

## 2021-07-15 MED ORDER — AMOXICILLIN-POT CLAVULANATE 875-125 MG PO TABS: 1.0000 | ORAL_TABLET | Freq: Two times a day (BID) | ORAL | 0 refills | Status: DC

## 2021-07-15 NOTE — Discharge Instructions (Addendum)
Your exam today was reassuring.  Given your symptoms you likely have a sinusitis.  I have given you prescription for Augmentin.  Take this to your choice of pharmacy.  If you have any worsening symptoms please return to the emergency room otherwise follow-up with your PCP as needed.

## 2021-07-15 NOTE — ED Triage Notes (Signed)
Reports having a cold x a week but has had severe right ear pain x 2 days.

## 2021-07-15 NOTE — ED Provider Notes (Signed)
[facility] EMERGENCY DEPARTMENT Provider Note   CSN: [id number] Arrival date & time: 07/15/21  0175     History  Chief Complaint  Patient presents with   Otalgia    Danielle Acevedo is a 81 y.o. female.  81 year old female who is otherwise healthy presents today for evaluation of right earache for the past 2-day duration.  She reports URI symptoms for the past 2 weeks.  Still reports sinus congestion, PND, and rhinorrhea.  Denies fever, chills, balance issues, visual change, difficulty hearing out of this ear, or headache.  Had tried Coricidin over-the-counter for her URI symptoms over the past couple weeks.  She states she still lives an active lifestyle and jogs regularly.  The history is provided by the patient. No language interpreter was used.      Home Medications Prior to Admission medications   Not on File      Allergies    Patient has no known allergies.    Review of Systems   Review of Systems  Constitutional:  Negative for chills and fever.  HENT:  Positive for congestion, ear pain, postnasal drip and sinus pressure. Negative for ear discharge and sore throat.   Eyes:  Negative for visual disturbance.  Respiratory:  Negative for cough and shortness of breath.   Neurological:  Negative for headaches.  All other systems reviewed and are negative.  Physical Exam Updated Vital Signs BP (!) 160/88 (BP Location: Right Arm)   Pulse (!) 54   Temp 98.2 F (36.8 C) (Oral)   Resp 16   Ht 5\' 3"  (1.6 m)   Wt 52.2 kg   SpO2 99%   BMI 20.37 kg/m  Physical Exam Vitals and nursing note reviewed.  Constitutional:      General: She is not in acute distress.    Appearance: Normal appearance. She is not ill-appearing.  HENT:     Head: Normocephalic and atraumatic.     Right Ear: Tympanic membrane, ear canal and external ear normal. There is no impacted cerumen.     Left Ear: Tympanic membrane, ear canal and external ear normal. There is no impacted  cerumen.     Nose: Nose normal.     Mouth/Throat:     Mouth: Mucous membranes are moist.     Pharynx: No oropharyngeal exudate or posterior oropharyngeal erythema.  Eyes:     General: No scleral icterus.    Extraocular Movements: Extraocular movements intact.     Conjunctiva/sclera: Conjunctivae normal.  Cardiovascular:     Rate and Rhythm: Normal rate and regular rhythm.     Pulses: Normal pulses.  Pulmonary:     Effort: Pulmonary effort is normal. No respiratory distress.     Breath sounds: Normal breath sounds. No wheezing or rales.  Musculoskeletal:        General: Normal range of motion.     Cervical back: Normal range of motion.  Skin:    General: Skin is warm and dry.  Neurological:     General: No focal deficit present.     Mental Status: She is alert. Mental status is at baseline.    ED Results / Procedures / Treatments   Labs (all labs ordered are listed, but only abnormal results are displayed) Labs Reviewed - No data to display  EKG None  Radiology No results found.  Procedures Procedures    Medications Ordered in ED Medications - No data to display  ED Course/ Medical Decision Making/ A&P  Medical Decision Making  81 year old female presents today for evaluation of right earache.  Ongoing for 2 days.  URI symptoms for the past 2 weeks.  Still with sinus congestion, PND.  Denies cough, fever.  Low suspicion for pneumonia.  She is well-appearing.  Still active and jogging without difficulty.  Bradycardia however this appears to be at her baseline.  Given her symptoms this is consistent with sinusitis.  Without concerning symptoms such as visual change, headache, balance issues.  We will provide Augmentin.  Return precautions discussed.  Patient voices understanding and is in agreement with plan.  Discussed follow-up with PCP.   Final Clinical Impression(s) / ED Diagnoses Final diagnoses:  Sinusitis, unspecified chronicity,  unspecified location    Rx / DC Orders ED Discharge Orders          Ordered    amoxicillin-clavulanate (AUGMENTIN) 875-125 MG tablet  Every 12 hours        07/15/21 0904              Marita [place], PA-C 07/15/21 0920    Arby Barrette, MD 07/15/21 1146

## 2024-02-29 ENCOUNTER — Ambulatory Visit (INDEPENDENT_AMBULATORY_CARE_PROVIDER_SITE_OTHER)

## 2024-02-29 ENCOUNTER — Ambulatory Visit
Admission: EM | Admit: 2024-02-29 | Discharge: 2024-02-29 | Disposition: A | Discharge status: Home / Self Care | Attending: Family Medicine | Admitting: Family Medicine

## 2024-02-29 DIAGNOSIS — R053 Chronic cough: Secondary | ICD-10-CM | POA: Diagnosis not present

## 2024-02-29 DIAGNOSIS — R030 Elevated blood-pressure reading, without diagnosis of hypertension: Secondary | ICD-10-CM

## 2024-02-29 NOTE — ED Provider Notes (Signed)
 " [facility] [employer]  Note:  This document was prepared using Conservation officer, historic buildings and may include unintentional dictation errors.  MRN: [id number] DOB: 27-Aug-1940  Subjective:   Danielle Acevedo is a 84 y.o. female presenting for 1 month history of chest cold per patient's family member who presents with her. Reports that the patient has had a cough, hoarseness, chest congestion. The family is primarily concerned that she may have pneumonia. The patient herself denies malaise, cough, chest congestion, shortness of breath. No asthma. No smoking of any kind including cigarettes, cigars, vaping, marijuana use.  Patient does not endorse any respiratory symptoms, feels like her normal self but was agreeable to be checked at her family's request.   No current outpatient medications   Allergies[1]  History reviewed. No pertinent past medical history.   History reviewed. No pertinent surgical history.  No family history on file.  Social History   Occupational History   Not on file  Tobacco Use   Smoking status: Former    Types: Cigarettes   Smokeless tobacco: Never  Vaping Use   Vaping status: Never Used  Substance and Sexual Activity   Alcohol use: No   Drug use: No   Sexual activity: Not on file     ROS   Objective:   Vitals: BP (!) 169/91 (BP Location: Right Arm)   Pulse 66   Temp 98.8 F (37.1 C) (Oral)   Resp 20   SpO2 96%   BP Readings from Last 3 Encounters:  02/29/24 (!) 169/91  07/15/21 (!) 160/88  04/09/18 (!) 137/52   Physical Exam Constitutional:      General: She is not in acute distress.    Appearance: Normal appearance. She is well-developed. She is not ill-appearing, toxic-appearing or diaphoretic.  HENT:     Head: Normocephalic and atraumatic.     Nose: Nose normal.     Mouth/Throat:     Mouth: Mucous membranes are moist.     Pharynx: No pharyngeal swelling, oropharyngeal exudate, posterior oropharyngeal erythema or  uvula swelling.     Tonsils: No tonsillar exudate or tonsillar abscesses. 0 on the right. 0 on the left.  Eyes:     General: No scleral icterus.       Right eye: No discharge.        Left eye: No discharge.     Extraocular Movements: Extraocular movements intact.  Cardiovascular:     Rate and Rhythm: Normal rate and regular rhythm.     Heart sounds: Normal heart sounds. No murmur heard.    No friction rub. No gallop.  Pulmonary:     Effort: Pulmonary effort is normal. No respiratory distress.     Breath sounds: No stridor. No wheezing, rhonchi or rales.  Chest:     Chest wall: No tenderness.  Skin:    General: Skin is warm and dry.  Neurological:     General: No focal deficit present.     Mental Status: She is alert and oriented to person, place, and time.  Psychiatric:        Mood and Affect: Mood normal.        Behavior: Behavior normal.    DG Chest 2 View Result Date: 02/29/2024 EXAM: 2 VIEW(S) XRAY OF THE CHEST 02/29/2024 05:54:20 PM COMPARISON: None available. CLINICAL HISTORY: 1 month history of cough 1 month history of cough 1 month history of cough 1 month history of cough FINDINGS: LUNGS AND PLEURA: No focal pulmonary  opacity. No pleural effusion. No pneumothorax. HEART AND MEDIASTINUM: No acute abnormality of the cardiac and mediastinal silhouettes. BONES AND SOFT TISSUES: No acute osseous abnormality. IMPRESSION: 1. No acute process. Electronically signed by: Franky Crease MD 02/29/2024 06:04 PM EST RP Workstation: [id number]    Assessment and Plan :   PDMP not reviewed this encounter.  1. Persistent cough for 3 weeks or longer   2. Elevated blood pressure reading without diagnosis of hypertension      Chest x-ray negative. Recommend supportive care. Follow up with PCP regarding her blood pressure.     [1] No Known Allergies    Christopher Savannah, PA-C 02/29/24 1825  "

## 2024-02-29 NOTE — ED Triage Notes (Signed)
 Per pt and daughter pt c/o cough and chest congestion x 1 month-taking cough syrup and drops-NAD-steady gait
